# Patient Record
Sex: Female | Born: 1965 | Race: White | Hispanic: No | Smoking: Never smoker
Health system: Southern US, Community
[De-identification: ages and names within clinical notes are randomized; demographics above are authoritative.]

## PROBLEM LIST (undated history)

## (undated) DIAGNOSIS — R768 Other specified abnormal immunological findings in serum: Secondary | ICD-10-CM

## (undated) DIAGNOSIS — K219 Gastro-esophageal reflux disease without esophagitis: Secondary | ICD-10-CM

## (undated) DIAGNOSIS — R5383 Other fatigue: Secondary | ICD-10-CM

## (undated) DIAGNOSIS — D219 Benign neoplasm of connective and other soft tissue, unspecified: Secondary | ICD-10-CM

## (undated) DIAGNOSIS — R609 Edema, unspecified: Secondary | ICD-10-CM

## (undated) DIAGNOSIS — K5900 Constipation, unspecified: Secondary | ICD-10-CM

## (undated) DIAGNOSIS — E039 Hypothyroidism, unspecified: Secondary | ICD-10-CM

## (undated) DIAGNOSIS — D649 Anemia, unspecified: Secondary | ICD-10-CM

## (undated) DIAGNOSIS — M2550 Pain in unspecified joint: Secondary | ICD-10-CM

## (undated) DIAGNOSIS — N926 Irregular menstruation, unspecified: Secondary | ICD-10-CM

## (undated) DIAGNOSIS — R7689 Other specified abnormal immunological findings in serum: Secondary | ICD-10-CM

## (undated) HISTORY — DX: Benign neoplasm of connective and other soft tissue, unspecified: D21.9

## (undated) HISTORY — DX: Other specified abnormal immunological findings in serum: R76.89

## (undated) HISTORY — DX: Anemia, unspecified: D64.9

## (undated) HISTORY — DX: Other specified abnormal immunological findings in serum: R76.8

## (undated) HISTORY — DX: Irregular menstruation, unspecified: N92.6

## (undated) HISTORY — DX: Pain in unspecified joint: M25.50

## (undated) HISTORY — DX: Constipation, unspecified: K59.00

## (undated) HISTORY — DX: Gastro-esophageal reflux disease without esophagitis: K21.9

## (undated) HISTORY — DX: Edema, unspecified: R60.9

## (undated) HISTORY — DX: Other fatigue: R53.83

## (undated) HISTORY — DX: Hypothyroidism, unspecified: E03.9

## (undated) HISTORY — PX: CHOLECYSTECTOMY: SHX55

## (undated) HISTORY — PX: TUBAL LIGATION: SHX77

---

## 2016-12-07 ENCOUNTER — Encounter: Payer: Self-pay | Admitting: Gastroenterology

## 2016-12-30 ENCOUNTER — Other Ambulatory Visit: Payer: Self-pay

## 2016-12-30 ENCOUNTER — Encounter: Payer: Self-pay | Admitting: Nurse Practitioner

## 2016-12-30 ENCOUNTER — Ambulatory Visit (INDEPENDENT_AMBULATORY_CARE_PROVIDER_SITE_OTHER): Payer: BLUE CROSS/BLUE SHIELD | Admitting: Nurse Practitioner

## 2016-12-30 ENCOUNTER — Ambulatory Visit (HOSPITAL_COMMUNITY)
Admission: RE | Admit: 2016-12-30 | Discharge: 2016-12-30 | Disposition: A | Discharge status: Home / Self Care | Payer: BLUE CROSS/BLUE SHIELD | Source: Ambulatory Visit | Attending: Nurse Practitioner | Admitting: Nurse Practitioner

## 2016-12-30 DIAGNOSIS — K5900 Constipation, unspecified: Secondary | ICD-10-CM

## 2016-12-30 DIAGNOSIS — K219 Gastro-esophageal reflux disease without esophagitis: Secondary | ICD-10-CM | POA: Insufficient documentation

## 2016-12-30 DIAGNOSIS — Z0000 Encounter for general adult medical examination without abnormal findings: Secondary | ICD-10-CM | POA: Diagnosis not present

## 2016-12-30 DIAGNOSIS — Z9049 Acquired absence of other specified parts of digestive tract: Secondary | ICD-10-CM | POA: Insufficient documentation

## 2016-12-30 DIAGNOSIS — Z1211 Encounter for screening for malignant neoplasm of colon: Secondary | ICD-10-CM

## 2016-12-30 MED ORDER — NA SULFATE-K SULFATE-MG SULF 17.5-3.13-1.6 GM/177ML PO SOLN: 1.0000 | ORAL | 0 refills | Status: DC

## 2016-12-30 NOTE — Progress Notes (Addendum)
REVIEWED-NO ADDITIONAL RECOMMENDATIONS.  Primary Care Physician:  Haynes Hoehn, NP Primary Gastroenterologist:  Dr. Oneida Alar  Chief Complaint  Patient presents with  . Gastroesophageal Reflux    had pain in throat, clears throat alot    HPI:   Claire White is a 51 y.o. female who presents on referral from primary care/women's health for GERD refractory to multiple interventions. PCP notes reviewed. Has a long-standing history of GERD but abruptly worsened in November. She stopped gluten products and sodas but continues a sore throat and hoarseness, also stopped Protonix, Amitiza, ibuprofen with no change. I'll is that of her bed, no other interventions have helped.  Today she states she hasn't really had chronic GERD. She began having symptoms about 3-4 months ago. She is also due for first ever screening colonoscopy. Has reflux symptoms daily. Stopped carbonated beverages. Rare esophageal/throat burning. Has mid-chest burgling, sore throat, chronic cough/clearing of her throat. Denies epigastric pain. Is on Prilosec twice daily OTC. Does have lower abdominal pain which is intermittent, dull, uncomfortable with movement. Discomfort improves with bowel movement. Has a bowel movement about every day. Was on Amitiza which she stopped due to loose stools. Stools consistent with [place] 1, sensation of incomplete emptying. Denies hematochezia, melena, fever, chills, unintentional weight loss. Denies chest pain, dyspnea, dizziness, lightheadedness, syncope, near syncope. Denies any other upper or lower GI symptoms.  Past Medical History:  Diagnosis Date  . ANA positive   . Anemia   . Arthralgia   . Constipation   . Edema   . Fatigue   . Fibroids   . GERD (gastroesophageal reflux disease)   . Hypothyroidism   . Irregular menstruation     Past Surgical History:  Procedure Laterality Date  . CHOLECYSTECTOMY      Current Outpatient Prescriptions  Medication Sig Dispense Refill  . AMITIZA 8  MCG capsule Take 1 tablet by mouth daily.  5  . FLUoxetine (PROZAC) 40 MG capsule Take 40 mg by mouth daily.    . hydrochlorothiazide (HYDRODIURIL) 25 MG tablet Take 25 mg by mouth daily.  12  . levothyroxine (SYNTHROID, LEVOTHROID) 137 MCG tablet Take 137 mcg by mouth daily before breakfast.    . omeprazole (PRILOSEC) 20 MG capsule Take 20 mg by mouth 2 (two) times daily.    . ORSYTHIA 0.1-20 MG-MCG tablet Take 1 tablet by mouth daily.  12  . Polyethylene Glycol 3350 (MIRALAX PO) Take by mouth as needed.    . Na Sulfate-K Sulfate-Mg Sulf (SUPREP BOWEL PREP KIT) 17.5-3.13-1.6 GM/180ML SOLN Take 1 kit by mouth as directed. 1 Bottle 0   No current facility-administered medications for this visit.     Allergies as of 12/30/2016 - Review Complete 12/30/2016  Allergen Reaction Noted  . Codeine Anaphylaxis 12/30/2016    Family History  Problem Relation Age of Onset  . Colon cancer Neg Hx     Social History   Social History  . Marital status: Divorced    Spouse name: N/A  . Number of children: N/A  . Years of education: N/A   Occupational History  . Not on file.   Social History Main Topics  . Smoking status: Never Smoker  . Smokeless tobacco: Never Used  . Alcohol use No  . Drug use: No  . Sexual activity: Not on file   Other Topics Concern  . Not on file   Social History Narrative  . No narrative on file    Review of Systems: Complete ROS negative except  as per HPI.    Physical Exam: BP (!) 153/91   Pulse (!) 104   Temp 98.2 F (36.8 C) (Oral)   Ht _0  (1.6 m)   Wt 196 lb 6.4 oz (89.1 kg)   LMP 12/25/2016 (Approximate) Comment: birth control  BMI 34.79 kg/m  General:   Alert and oriented. Pleasant and cooperative. Well-nourished and well-developed.  Head:  Normocephalic and atraumatic. Eyes:  Without icterus, sclera clear and conjunctiva pink.  Ears:  Normal auditory acuity. Cardiovascular:  S1, S2 present without murmurs appreciated. Extremities without  clubbing or edema. Respiratory:  Clear to auscultation bilaterally. No wheezes, rales, or rhonchi. No distress.  Gastrointestinal:  +BS, soft, non-tender and non-distended. No HSM noted. No guarding or rebound. No masses appreciated.  Rectal:  Deferred  Musculoskalatal:  Symmetrical without gross deformities. Neurologic:  Alert and oriented x4;  grossly normal neurologically. Psych:  Alert and cooperative. Normal mood and affect. Heme/Lymph/Immune: No excessive bruising noted.    12/30/2016 4:11 PM   Disclaimer: This note was dictated with voice recognition software. Similar sounding words can inadvertently be transcribed and may not be corrected upon review.

## 2016-12-30 NOTE — Assessment & Plan Note (Signed)
The patient is now age 51 and due for first-ever screening colonoscopy. She does have constipation, although I think this is simple see IC versus IBS-C and not really indicative of a diagnostic colonoscopy. We will work on her constipation in tandem with her due screening exam. Constipation measures as per above. Return for follow-up in 2 months.

## 2016-12-30 NOTE — Assessment & Plan Note (Signed)
The patient describes about a four-month history of reflux symptoms, possibly longer, typically manifesting as sore throat, chronic dry cough, occasional esophageal gurgling/discomfort. No overt epigastric pain. She is tried Prilosec over-the-counter without relief. At this point I will start her on Dexalone 60 mg once a day. We will provide samples for 1-2 weeks and ask her to call with progress report in 1-2 weeks. Return for follow-up in 2 months.

## 2016-12-30 NOTE — Assessment & Plan Note (Signed)
The patient has constipation, although the past couple weeks she seems to be having looser stools. Amitiza caused diarrhea. At this point I will recommend she take a Colace stool softener once a day and MiraLAX one to 2 times a day as needed for no bowel movement in 2 days. Stop taking these if she has diarrhea. Return for follow-up in 2 months.

## 2016-12-30 NOTE — Patient Instructions (Addendum)
1. We will schedule your colonoscopy for you. 2. Take over-the-counter Colace stool softener once a day.  3. Use MiraLAX powder, 17 g, one to 2 times a day as needed for no bowel movement in 2 days. 4. Have your abdominal x-ray done when you can. 5. Start taking Dexilant 60 mg once a day. We will give you samples to last 1-2 weeks. Call us with a progress report on how it is helping in 1-2 weeks. 6. Return for follow-up in 2 months.

## 2016-12-30 NOTE — Progress Notes (Signed)
CC'ED TO PCP 

## 2017-01-04 NOTE — Progress Notes (Signed)
LMOM to call.

## 2017-01-08 ENCOUNTER — Ambulatory Visit (HOSPITAL_COMMUNITY)
Admission: RE | Admit: 2017-01-08 | Discharge: 2017-01-08 | Disposition: A | Discharge status: Home / Self Care | Payer: BLUE CROSS/BLUE SHIELD | Source: Ambulatory Visit | Attending: Gastroenterology | Admitting: Gastroenterology

## 2017-01-08 ENCOUNTER — Encounter (HOSPITAL_COMMUNITY)
Admission: RE | Disposition: A | Discharge status: Home / Self Care | Payer: Self-pay | Source: Ambulatory Visit | Attending: Gastroenterology

## 2017-01-08 ENCOUNTER — Encounter (HOSPITAL_COMMUNITY): Payer: Self-pay | Admitting: *Deleted

## 2017-01-08 DIAGNOSIS — Z1211 Encounter for screening for malignant neoplasm of colon: Secondary | ICD-10-CM

## 2017-01-08 DIAGNOSIS — K219 Gastro-esophageal reflux disease without esophagitis: Secondary | ICD-10-CM | POA: Diagnosis not present

## 2017-01-08 DIAGNOSIS — E039 Hypothyroidism, unspecified: Secondary | ICD-10-CM | POA: Insufficient documentation

## 2017-01-08 DIAGNOSIS — K644 Residual hemorrhoidal skin tags: Secondary | ICD-10-CM | POA: Insufficient documentation

## 2017-01-08 DIAGNOSIS — Z79899 Other long term (current) drug therapy: Secondary | ICD-10-CM | POA: Diagnosis not present

## 2017-01-08 DIAGNOSIS — K5900 Constipation, unspecified: Secondary | ICD-10-CM | POA: Diagnosis not present

## 2017-01-08 DIAGNOSIS — Z1212 Encounter for screening for malignant neoplasm of rectum: Secondary | ICD-10-CM | POA: Diagnosis not present

## 2017-01-08 DIAGNOSIS — Z793 Long term (current) use of hormonal contraceptives: Secondary | ICD-10-CM | POA: Diagnosis not present

## 2017-01-08 DIAGNOSIS — K621 Rectal polyp: Secondary | ICD-10-CM | POA: Insufficient documentation

## 2017-01-08 DIAGNOSIS — Q438 Other specified congenital malformations of intestine: Secondary | ICD-10-CM | POA: Insufficient documentation

## 2017-01-08 HISTORY — PX: COLONOSCOPY: SHX5424

## 2017-01-08 SURGERY — COLONOSCOPY
Anesthesia: Moderate Sedation

## 2017-01-08 MED ORDER — MIDAZOLAM HCL 5 MG/5ML IJ SOLN
INTRAMUSCULAR | Status: DC | PRN
  Administered 2017-01-08 (×3): 2 mg via INTRAVENOUS

## 2017-01-08 MED ORDER — SODIUM CHLORIDE 0.9 % IV SOLN
INTRAVENOUS | Status: DC
  Administered 2017-01-08: 1000 mL via INTRAVENOUS

## 2017-01-08 MED ORDER — MIDAZOLAM HCL 5 MG/5ML IJ SOLN
INTRAMUSCULAR | Status: AC
  Filled 2017-01-08: qty 10

## 2017-01-08 MED ORDER — SIMETHICONE 40 MG/0.6ML PO SUSP
ORAL | Status: DC | PRN
  Administered 2017-01-08: 2.5 mL

## 2017-01-08 MED ORDER — MEPERIDINE HCL 100 MG/ML IJ SOLN
INTRAMUSCULAR | Status: AC
  Filled 2017-01-08: qty 2

## 2017-01-08 MED ORDER — MEPERIDINE HCL 100 MG/ML IJ SOLN
INTRAMUSCULAR | Status: DC | PRN
  Administered 2017-01-08 (×3): 25 mg via INTRAVENOUS

## 2017-01-08 NOTE — Op Note (Signed)
[facility] Patient Name: Claire White Procedure Date: 01/08/2017 11:32 AM MRN: [id number] Date of Birth: 1966-07-31 Attending MD: Barney Drain , MD CSN: [id number] Age: 51 Admit Type: Outpatient Procedure:                Colonoscopy WITH COLD FORCEPS POLYPECTOMY Indications:              Screening for colorectal malignant neoplasm Providers:                Barney Drain, MD, Charlyne Petrin RN, RN, Isabella Stalling, Technician Referring MD:             Georgeanna Lea. Weeks Medicines:                Meperidine 75 mg IV, Midazolam 5 mg IV Complications:            No immediate complications. Estimated Blood Loss:     Estimated blood loss was minimal. Procedure:                Pre-Anesthesia Assessment:                           - Prior to the procedure, a History and Physical                            was performed, and patient medications and                            allergies were reviewed. The patient's tolerance of                            previous anesthesia was also reviewed. The risks                            and benefits of the procedure and the sedation                            options and risks were discussed with the patient.                            All questions were answered, and informed consent                            was obtained. Prior Anticoagulants: The patient has                            taken no previous anticoagulant or antiplatelet                            agents. ASA Grade Assessment: II - A patient with                            mild systemic disease. After reviewing the risks  and benefits, the patient was deemed in                            satisfactory condition to undergo the procedure.                            After obtaining informed consent, the colonoscope                            was passed under direct vision. Throughout the                            procedure, the patient's  blood pressure, pulse, and                            oxygen saturations were monitored continuously. The                            [id number] scope was introduced through                            the anus and advanced to the the cecum, identified                            by appendiceal orifice and ileocecal valve. The                            colonoscopy was somewhat difficult due to a                            tortuous colon. Successful completion of the                            procedure was aided by COLOWRAP. The patient                            tolerated the procedure well. The quality of the                            bowel preparation was excellent. The ileocecal                            valve, appendiceal orifice, and rectum were                            photographed. Scope In: 12:05:39 PM Scope Out: 12:23:30 PM Scope Withdrawal Time: 0 hours 14 minutes 34 seconds  Total Procedure Duration: 0 hours 17 minutes 51 seconds  Findings:      The recto-sigmoid colon, sigmoid colon and descending colon were       moderately redundant.      Four sessile polyps were found in the rectum. The polyps were 2 to 4 mm       in size. These polyps were removed with a cold biopsy forceps. Resection  and retrieval were complete.      Multiple small and large-mouthed diverticula were found in the entire       colon.      External hemorrhoids were found during retroflexion. The hemorrhoids       were small. Impression:               - Redundant LEFT colon.                           - Four 2 to 4 mm polyps in the rectum, removed                           - MODERATE Diverticulosis in the entire examined                            colon.                           - External hemorrhoids. Moderate Sedation:      Moderate (conscious) sedation was administered by the endoscopy nurse       and supervised by the endoscopist. The following parameters were       monitored:  oxygen saturation, heart rate, blood pressure, and response       to care. Total physician intraservice time was 15 minutes. Recommendation:           - Repeat colonoscopy in 5-10 years for surveillance.                           - High fiber diet.                           - Continue present medications.                           - Await pathology results.                           - Patient has a contact number available for                            emergencies. The signs and symptoms of potential                            delayed complications were discussed with the                            patient. Return to normal activities tomorrow.                            Written discharge instructions were provided to the                            patient. Procedure Code(s):        --- Professional ---                           [phone], Colonoscopy,  flexible; with biopsy, single                            or multiple                           99152, Moderate sedation services provided by the                            same physician or other qualified health care                            professional performing the diagnostic or                            therapeutic service that the sedation supports,                            requiring the presence of an independent trained                            observer to assist in the monitoring of the                            patient's level of consciousness and physiological                            status; initial 15 minutes of intraservice time,                            patient age 56 years or older Diagnosis Code(s):        --- Professional ---                           Z12.11, Encounter for screening for malignant                            neoplasm of colon                           K62.1, Rectal polyp                           K64.4, Residual hemorrhoidal skin tags                           K57.30, Diverticulosis of large  intestine without                            perforation or abscess without bleeding                           Q43.8, Other specified congenital malformations of                            intestine CPT copyright 2016 [employer] Association. All  rights reserved. The codes documented in this report are preliminary and upon coder review may  be revised to meet current compliance requirements. Barney Drain, MD Barney Drain, MD 01/08/2017 12:45:30 PM This report has been signed electronically. Number of Addenda: 0

## 2017-01-08 NOTE — Discharge Instructions (Signed)
You have small internal hemorrhoids and diverticulosis IN YOUR LEFT AND RIGHT COLON. YOU HAD ONE SMALL POLYP REMOVED FROM YOUR RIGHT COLON.   DRINK WATER TO KEEP YOUR URINE LIGHT YELLOW.  CONTINUE YOUR WEIGHT LOSS EFFORTS.  WHILE I DO NOT WANT TO ALARM YOU, YOUR BODY MASS INDEX IS OVER 30 WHICH MEANS YOU ARE OBESE. OBESITY DRIVES CANCER GENES AND  IS ASSOCIATED WITH AN INCREASE RISK FOR ALL CANCERS, INCLUDING ESOPHAGEAL AND COLON CANCER.  FOLLOW A HIGH FIBER DIET. AVOID ITEMS THAT CAUSE BLOATING. See info below.  YOUR BIOPSY RESULTS WILL BE AVAILABLE IN MY CHART AFTER FEB 27 AND MY OFFICE WILL CONTACT YOU IN 10-14 DAYS WITH YOUR RESULTS.   Next colonoscopy in 5-10 years.  Colonoscopy Care After Read the instructions outlined below and refer to this sheet in the next week. These discharge instructions provide you with general information on caring for yourself after you leave the hospital. While your treatment has been planned according to the most current medical practices available, unavoidable complications occasionally occur. If you have any problems or questions after discharge, call DR. Sherrine Salberg, [phone].  ACTIVITY  You may resume your regular activity, but move at a slower pace for the next 24 hours.   Take frequent rest periods for the next 24 hours.   Walking will help get rid of the air and reduce the bloated feeling in your belly (abdomen).   No driving for 24 hours (because of the medicine (anesthesia) used during the test).   You may shower.   Do not sign any important legal documents or operate any machinery for 24 hours (because of the anesthesia used during the test).    NUTRITION  Drink plenty of fluids.   You may resume your normal diet as instructed by your doctor.   Begin with a light meal and progress to your normal diet. Heavy or fried foods are harder to digest and may make you feel sick to your stomach (nauseated).   Avoid alcoholic beverages for 24  hours or as instructed.    MEDICATIONS  You may resume your normal medications.   WHAT YOU CAN EXPECT TODAY  Some feelings of bloating in the abdomen.   Passage of more gas than usual.   Spotting of blood in your stool or on the toilet paper  .  IF YOU HAD POLYPS REMOVED DURING THE COLONOSCOPY:  Eat a soft diet IF YOU HAVE NAUSEA, BLOATING, ABDOMINAL PAIN, OR VOMITING.    FINDING OUT THE RESULTS OF YOUR TEST Not all test results are available during your visit. DR. Oneida Alar WILL CALL YOU WITHIN 14 DAYS OF YOUR PROCEDUE WITH YOUR RESULTS. Do not assume everything is normal if you have not heard from DR. Kensington Duerst  SEEK IMMEDIATE MEDICAL ATTENTION AND CALL THE OFFICE: [phone] IF:  You have more than a spotting of blood in your stool.   Your belly is swollen (abdominal distention).   You are nauseated or vomiting.   You have a temperature over 101F.   You have abdominal pain or discomfort that is severe or gets worse throughout the day.  High-Fiber Diet A high-fiber diet changes your normal diet to include more whole grains, legumes, fruits, and vegetables. Changes in the diet involve replacing refined carbohydrates with unrefined foods. The calorie level of the diet is essentially unchanged. The Dietary Reference Intake (recommended amount) for adult males is 38 grams per day. For adult females, it is 25 grams per day. Pregnant and lactating women  should consume 28 grams of fiber per day. Fiber is the intact part of a plant that is not broken down during digestion. Functional fiber is fiber that has been isolated from the plant to provide a beneficial effect in the body. PURPOSE  Increase stool bulk.   Ease and regulate bowel movements.   Lower cholesterol.   REDUCE RISK OF COLON CANCER  INDICATIONS THAT YOU NEED MORE FIBER  Constipation and hemorrhoids.   Uncomplicated diverticulosis (intestine condition) and irritable bowel syndrome.   Weight management.   As  a protective measure against hardening of the arteries (atherosclerosis), diabetes, and cancer.   GUIDELINES FOR INCREASING FIBER IN THE DIET  Start adding fiber to the diet slowly. A gradual increase of about 5 more grams (2 slices of whole-wheat bread, 2 servings of most fruits or vegetables, or 1 bowl of high-fiber cereal) per day is best. Too rapid an increase in fiber may result in constipation, flatulence, and bloating.   Drink enough water and fluids to keep your urine clear or pale yellow. Water, juice, or caffeine-free drinks are recommended. Not drinking enough fluid may cause constipation.   Eat a variety of high-fiber foods rather than one type of fiber.   Try to increase your intake of fiber through using high-fiber foods rather than fiber pills or supplements that contain small amounts of fiber.   The goal is to change the types of food eaten. Do not supplement your present diet with high-fiber foods, but replace foods in your present diet.   Polyps, Colon  A polyp is extra tissue that grows inside your body. Colon polyps grow in the large intestine. The large intestine, also called the colon, is part of your digestive system. It is a long, hollow tube at the end of your digestive tract where your body makes and stores stool. Most polyps are not dangerous. They are benign. This means they are not cancerous. But over time, some types of polyps can turn into cancer. Polyps that are smaller than a pea are usually not harmful. But larger polyps could someday become or may already be cancerous. To be safe, doctors remove all polyps and test them.   PREVENTION There is not one sure way to prevent polyps. You might be able to lower your risk of getting them if you:  Eat more fruits and vegetables and less fatty food.   Do not smoke.   Avoid alcohol.   Exercise every day.   Lose weight if you are overweight.   Eating more calcium and folate can also lower your risk of getting  polyps. Some foods that are rich in calcium are milk, cheese, and broccoli. Some foods that are rich in folate are chickpeas, kidney beans, and spinach.    Diverticulosis Diverticulosis is a common condition that develops when small pouches (diverticula) form in the wall of the colon. The risk of diverticulosis increases with age. It happens more often in people who eat a low-fiber diet. Most individuals with diverticulosis have no symptoms. Those individuals with symptoms usually experience belly (abdominal) pain, constipation, or loose stools (diarrhea).  HOME CARE INSTRUCTIONS  Increase the amount of fiber in your diet as directed by your caregiver or dietician. This may reduce symptoms of diverticulosis.   Drink at least 6 to 8 glasses of water each day to prevent constipation.   Try not to strain when you have a bowel movement.   Avoiding nuts and seeds to prevent complications is  NOT NECESSARY.  FOODS HAVING HIGH FIBER CONTENT INCLUDE:  Fruits. Apple, peach, pear, tangerine, raisins, prunes.   Vegetables. Brussels sprouts, asparagus, broccoli, cabbage, carrot, cauliflower, romaine lettuce, spinach, summer squash, tomato, winter squash, zucchini.   Starchy Vegetables. Baked beans, kidney beans, lima beans, split peas, lentils, potatoes (with skin).   Grains. Whole wheat bread, brown rice, bran flake cereal, plain oatmeal, white rice, shredded wheat, bran muffins.    SEEK IMMEDIATE MEDICAL CARE IF:  You develop increasing pain or severe bloating.   You have an oral temperature above 101F.   You develop vomiting or bowel movements that are bloody or black.   Hemorrhoids Hemorrhoids are dilated (enlarged) veins around the rectum. Sometimes clots will form in the veins. This makes them swollen and painful. These are called thrombosed hemorrhoids. Causes of hemorrhoids include:  Constipation.   Straining to have a bowel movement.   HEAVY LIFTING  HOME CARE  INSTRUCTIONS  Eat a well balanced diet and drink 6 to 8 glasses of water every day to avoid constipation. You may also use a bulk laxative.   Avoid straining to have bowel movements.   Keep anal area dry and clean.   Do not use a donut shaped pillow or sit on the toilet for long periods. This increases blood pooling and pain.   Move your bowels when your body has the urge; this will require less straining and will decrease pain and pressure.

## 2017-01-08 NOTE — H&P (Signed)
  Primary Care Physician:  Haynes Hoehn, NP Primary Gastroenterologist:  Dr. Oneida Alar  Pre-Procedure History & Physical: HPI:  Claire White is a 51 y.o. female here for [place].  Past Medical History:  Diagnosis Date  . ANA positive   . Anemia   . Arthralgia   . Constipation   . Edema   . Fatigue   . Fibroids   . GERD (gastroesophageal reflux disease)   . Hypothyroidism   . Irregular menstruation     Past Surgical History:  Procedure Laterality Date  . CHOLECYSTECTOMY    . TUBAL LIGATION      Prior to Admission medications   Medication Sig Start Date End Date Taking? Authorizing Provider  dexlansoprazole (DEXILANT) 60 MG capsule Take 60 mg by mouth daily.   Yes Historical Provider, MD  FLUoxetine (PROZAC) 40 MG capsule Take 40 mg by mouth daily. 06/24/12  Yes Historical Provider, MD  hydrochlorothiazide (HYDRODIURIL) 25 MG tablet Take 25 mg by mouth daily. 11/30/16  Yes Historical Provider, MD  levothyroxine (SYNTHROID, LEVOTHROID) 137 MCG tablet Take 137 mcg by mouth daily before breakfast.   Yes Historical Provider, MD  ORSYTHIA 0.1-20 MG-MCG tablet Take 1 tablet by mouth daily. 12/13/16  Yes Historical Provider, MD  Polyethylene Glycol 3350 (MIRALAX PO) Take 17 g by mouth daily as needed.    Yes Historical Provider, MD    Allergies as of 12/30/2016 - Review Complete 12/30/2016  Allergen Reaction Noted  . Codeine Anaphylaxis 12/30/2016    Family History  Problem Relation Age of Onset  . Heart disease Father   . Alcoholism Brother   . Colon cancer Neg Hx     Social History   Social History  . Marital status: Divorced    Spouse name: N/A  . Number of children: N/A  . Years of education: N/A   Occupational History  . Not on file.   Social History Main Topics  . Smoking status: Never Smoker  . Smokeless tobacco: Never Used  . Alcohol use No  . Drug use: No  . Sexual activity: Not on file   Other Topics Concern  . Not on file   Social History  Narrative  . No narrative on file    Review of Systems: See HPI, otherwise negative ROS   Physical Exam: BP 122/75   Pulse 87   Temp 97.9 F (36.6 C) (Oral)   Resp 11   Ht 5\' 3"  (1.6 m)   Wt 196 lb (88.9 kg)   LMP 12/25/2016 (Approximate) Comment: birth control  SpO2 98%   BMI 34.72 kg/m  General:   Alert,  pleasant and cooperative in NAD Head:  Normocephalic and atraumatic. Neck:  Supple; Lungs:  Clear throughout to auscultation.    Heart:  Regular rate and rhythm. Abdomen:  Soft, nontender and nondistended. Normal bowel sounds, without guarding, and without rebound.   Neurologic:  Alert and  oriented x4;  grossly normal neurologically.  Impression/Plan:     SCREENING  Plan:  1. TCS TODAY. DISCUSSED PROCEDURE, BENEFITS, & RISKS: < 1% chance of medication reaction, bleeding, perforation, or rupture of spleen/liver.

## 2017-01-12 ENCOUNTER — Encounter (HOSPITAL_COMMUNITY): Payer: Self-pay | Admitting: Gastroenterology

## 2017-01-13 ENCOUNTER — Telehealth: Payer: Self-pay | Admitting: Gastroenterology

## 2017-01-13 DIAGNOSIS — K219 Gastro-esophageal reflux disease without esophagitis: Secondary | ICD-10-CM

## 2017-01-13 NOTE — Telephone Encounter (Signed)
PATIENT WAS GIVEN DEXILANT SAMPLES AND SHE IS STILL HAVING REFLUX, IS THERE ANYTHING ELSE SHE CAN TRY?  [phone]  SHE STATED THE OTC MEDS SHE WAS TAKING FOR REFLUX WORKED BETTER BUT SHE HAD TO DOUBLE ON THE AMOUNT SHE WOULD TAKE OF THOSE

## 2017-01-14 NOTE — Telephone Encounter (Signed)
LMOM to call.

## 2017-01-19 NOTE — Telephone Encounter (Signed)
LMOM to call. Forwarding to Claire Field, NP who last saw pt in the office.

## 2017-01-19 NOTE — Telephone Encounter (Signed)
PT called and said she has tried the Prilosec/Omeprazole and had to double the amounts of those to take. Please advise!

## 2017-01-25 ENCOUNTER — Encounter: Payer: Self-pay | Admitting: Nurse Practitioner

## 2017-01-26 MED ORDER — PANTOPRAZOLE SODIUM 40 MG PO TBEC: 40.0000 mg | DELAYED_RELEASE_TABLET | Freq: Every day | ORAL | 2 refills | Status: DC

## 2017-01-26 NOTE — Telephone Encounter (Signed)
Please tell the patient I sent in an Rx for Protonix 40 mg. Take once daily on an empty stomach. Call us if no improvement in 2 weeks.   I'm not sure how the message was kicked from my in box, but will try to keep an eye out from further communication from her.

## 2017-01-26 NOTE — Addendum Note (Signed)
Addended by: Gordy Levan, ERIC A on: 01/26/2017 12:12 PM   Modules accepted: Orders

## 2017-01-28 NOTE — Telephone Encounter (Signed)
LMOM to call.

## 2017-01-29 NOTE — Telephone Encounter (Signed)
Pt is aware.  

## 2017-01-29 NOTE — Telephone Encounter (Signed)
LMOM to call.

## 2017-03-02 ENCOUNTER — Ambulatory Visit: Payer: BLUE CROSS/BLUE SHIELD | Admitting: Nurse Practitioner

## 2017-04-30 HISTORY — PX: FOOT SURGERY: SHX648

## 2017-05-07 ENCOUNTER — Other Ambulatory Visit: Payer: Self-pay | Admitting: Nurse Practitioner

## 2017-05-07 DIAGNOSIS — K219 Gastro-esophageal reflux disease without esophagitis: Secondary | ICD-10-CM

## 2017-05-27 ENCOUNTER — Encounter: Payer: Self-pay | Admitting: Nurse Practitioner

## 2017-06-01 ENCOUNTER — Encounter: Payer: Self-pay | Admitting: Nurse Practitioner

## 2017-06-11 ENCOUNTER — Other Ambulatory Visit: Payer: Self-pay | Admitting: Nurse Practitioner

## 2017-06-11 DIAGNOSIS — K219 Gastro-esophageal reflux disease without esophagitis: Secondary | ICD-10-CM

## 2017-06-11 MED ORDER — PANTOPRAZOLE SODIUM 40 MG PO TBEC: 40.0000 mg | DELAYED_RELEASE_TABLET | Freq: Two times a day (BID) | ORAL | 3 refills | Status: AC

## 2017-06-11 MED ORDER — LIDOCAINE VISCOUS 2 % MT SOLN: 10.0000 mL | Freq: Four times a day (QID) | OROMUCOSAL | 1 refills | Status: AC | PRN

## 2017-07-21 ENCOUNTER — Encounter: Payer: Self-pay | Admitting: Nurse Practitioner

## 2017-07-21 ENCOUNTER — Ambulatory Visit (INDEPENDENT_AMBULATORY_CARE_PROVIDER_SITE_OTHER): Payer: BLUE CROSS/BLUE SHIELD | Admitting: Nurse Practitioner

## 2017-07-21 VITALS — BP 159/86 | HR 109 | Temp 98.1°F | Ht 63.0 in | Wt 199.6 lb

## 2017-07-21 DIAGNOSIS — K219 Gastro-esophageal reflux disease without esophagitis: Secondary | ICD-10-CM | POA: Diagnosis not present

## 2017-07-21 DIAGNOSIS — K5900 Constipation, unspecified: Secondary | ICD-10-CM

## 2017-07-21 NOTE — Assessment & Plan Note (Signed)
The patient has some improvement constipation. She is taking Amitiza once a day which generally works well for her. She takes it twice a day regularly she has difficulty controlling her bowels. She will take a second dose as needed for worsening breakthrough constipation. She does have occasional sensation of incomplete emptying. I recommended she supplement of fiber in her diet with a fiber supplement available over-the-counter from the pharmacy. I discussed with her the multiple options available and she can she is likely she is most likely to stick with. Return for follow-up in 6 months. Call if any worsening problems.

## 2017-07-21 NOTE — Assessment & Plan Note (Signed)
GERD symptoms overall seem to be well controlled. No esophageal burning, epigastric pain, that her sour taste. The patient does have some "doubling feeling" which she attributes to GERD. She is currently on twice a day PPI. No other increase in therapy at this time. Dexon did not help. Has previously been on Protonix which did not help. Recommended continue use viscous lidocaine as needed. Call if any worsening. Return for follow-up in 6 months.

## 2017-07-21 NOTE — Patient Instructions (Signed)
1. Go to the pharmacy and find a fiber supplement. As we talked about, there are multiple options available including fruits use, powders, dissolvable liquids, gum each use, etc. If you need assistance she can ask the pharmacist for help in selecting the option best for you. 2. Continue taking your acid blocker twice a day. 3. Use viscous lidocaine as needed for breakthrough symptoms. 4. Return for follow-up in 6 months. 5. Call with any questions or concerns.

## 2017-07-21 NOTE — Progress Notes (Signed)
Referring Provider: Renne Crigler, NP Primary Care Physician:  Renne Crigler, NP Primary GI:  Dr. Oneida Alar  Chief Complaint  Patient presents with  . Constipation    HPI:   Claire White is a 51 y.o. female who presents for follow-up on GERD and constipation. The patient was last seen in our office 12/30/2016 for the same as well as to schedule screening colonoscopy. History of chronic, refractory GERD. At the time of her last visit she noted daily reflux symptoms, stopped carbonated beverages. Rare esophageal/throat burning but notes mid chest bubbling, sore throat, chronic cough and chronic clearing of her throat. On Prilosec twice a day at that time through over-the-counter formulation. Lower abdominal pain which is intermittent, dull, uncomfortable with movement. Discomfort improves with a bowel movement which she has about every day. Was on Amitiza but stopped due to loose stools. Has sensation of incomplete emptying and stools are hard consistent with [place] 1. No other GI symptoms. Recommended over-the-counter Colace stool softener once a day, MiraLAX one to 2 times a day if no bowel movement in 2 days, abdominal x-ray, start Dexilant 60 mg once a day with samples provided, schedule colonoscopy. Follow-up in 2 months.  Colonoscopy was completed 01/08/2017 which found redundant left colon, four 2-4 mm polyps in the rectum status post removal, moderate diverticulosis in the entire examined colon, external hemorrhoids. Surgical pathology found the polyps to be hyperplastic. Recommended repeat colonoscopy in 10 years, continue current medications, high-fiber diet.  Phone call indicated Dexilant was not effective and a prescription for Protonix 40 mg once daily was sent to her pharmacy. She called [place] again 05/27/2017 to say she was having worsening reflux still taking Protonix once a day 40 mg but is started taking over-the-counter omeprazole in the evenings as well. Her Protonix was increased to  40 mg twice a day, viscous lidocaine sent to the pharmacy for breakthrough. Denied NSAID use. No further communications.  Today she states she's doing well overall. GERD is persistent despite bid PPI. It is improved, however. Has breakthrough symptoms daily including "gurgling/bubbling" in her esophagus but denies pain, burning, bitter taste. Constipation is improved. Has a large bowel movement this past weekend. She takes Amitiza once daily unless she's having breakthrough constipation at which point she will take a second dose; If she takes bid dosing regularly has difficulty controlling her bowels. Does have intermittent sensation of incomplete emptying. Denies melena. Has rare scant toilet tissue hematochezia. Denies chest pain, dyspnea, dizziness, lightheadedness, syncope, near syncope. Denies any other upper or lower GI symptoms.  Past Medical History:  Diagnosis Date  . ANA positive   . Anemia   . Arthralgia   . Constipation   . Edema   . Fatigue   . Fibroids   . GERD (gastroesophageal reflux disease)   . Hypothyroidism   . Irregular menstruation     Past Surgical History:  Procedure Laterality Date  . CHOLECYSTECTOMY    . COLONOSCOPY N/A 01/08/2017   Procedure: COLONOSCOPY;  Surgeon: Danie Binder, MD;  Location: [facility];  Service: Endoscopy;  Laterality: N/A;  1115  . FOOT SURGERY Left 04/30/2017   repair of hammertoe and pinched nerve  . TUBAL LIGATION      Current Outpatient Prescriptions  Medication Sig Dispense Refill  . FLUoxetine (PROZAC) 40 MG capsule Take 40 mg by mouth daily.    . hydrochlorothiazide (HYDRODIURIL) 25 MG tablet Take 25 mg by mouth daily.  12  . levothyroxine (SYNTHROID,  LEVOTHROID) 137 MCG tablet Take 137 mcg by mouth daily before breakfast.    . lidocaine (XYLOCAINE) 2 % solution Use as directed 10 mLs in the mouth or throat every 6 (six) hours as needed for mouth pain. 100 mL 1  . lubiprostone (AMITIZA) 8 MCG capsule Take 8 mcg by mouth 2  (two) times daily with a meal.    . ORSYTHIA 0.1-20 MG-MCG tablet Take 1 tablet by mouth daily.  12  . pantoprazole (PROTONIX) 40 MG tablet Take 1 tablet (40 mg total) by mouth 2 (two) times daily before a meal. 60 tablet 3   No current facility-administered medications for this visit.     Allergies as of 07/21/2017 - Review Complete 07/21/2017  Allergen Reaction Noted  . Codeine Anaphylaxis 12/30/2016    Family History  Problem Relation Age of Onset  . Heart disease Father   . Alcoholism Brother   . Colon cancer Neg Hx     Social History   Social History  . Marital status: Divorced    Spouse name: N/A  . Number of children: N/A  . Years of education: N/A   Social History Main Topics  . Smoking status: Never Smoker  . Smokeless tobacco: Never Used  . Alcohol use No  . Drug use: No  . Sexual activity: Not Asked   Other Topics Concern  . None   Social History Narrative  . None    Review of Systems: Complete ROS negative except as per HPI.   Physical Exam: BP (!) 159/86   Pulse (!) 109   Temp 98.1 F (36.7 C) (Oral)   Ht 5\' 3"  (1.6 m)   Wt 199 lb 9.6 oz (90.5 kg)   BMI 35.36 kg/m  General:   Obese female. Alert and oriented. Pleasant and cooperative. Well-nourished and well-developed.  Eyes:  Without icterus, sclera clear and conjunctiva pink.  Ears:  Normal auditory acuity. Cardiovascular:  S1, S2 present without murmurs appreciated. Extremities without clubbing or edema. Respiratory:  Clear to auscultation bilaterally. No wheezes, rales, or rhonchi. No distress.  Gastrointestinal:  +BS, rounded but soft, non-tender and non-distended. No HSM noted. No guarding or rebound. No masses appreciated.  Rectal:  Deferred  Musculoskalatal:  Symmetrical without gross deformities.  Neurologic:  Alert and oriented x4;  grossly normal neurologically. Psych:  Alert and cooperative. Normal mood and affect. Heme/Lymph/Immune: No excessive bruising noted.    07/21/2017  4:29 PM   Disclaimer: This note was dictated with voice recognition software. Similar sounding words can inadvertently be transcribed and may not be corrected upon review.

## 2017-07-22 ENCOUNTER — Encounter: Payer: Self-pay | Admitting: Gastroenterology

## 2017-07-26 NOTE — Progress Notes (Signed)
CC'ED TO PCP 

## 2017-12-17 ENCOUNTER — Other Ambulatory Visit: Payer: Self-pay | Admitting: Nurse Practitioner

## 2017-12-17 DIAGNOSIS — K219 Gastro-esophageal reflux disease without esophagitis: Secondary | ICD-10-CM

## 2018-01-19 ENCOUNTER — Ambulatory Visit: Payer: BLUE CROSS/BLUE SHIELD | Admitting: Gastroenterology

## 2018-01-19 ENCOUNTER — Ambulatory Visit: Payer: BLUE CROSS/BLUE SHIELD | Admitting: Nurse Practitioner

## 2018-07-19 ENCOUNTER — Telehealth: Payer: Self-pay

## 2018-07-19 NOTE — Telephone Encounter (Signed)
Pt was seen at [facility] today. Released was signed and faxed to our office. Please send records to this office. TCS report was faxed per Dr's request today. Release is located on [employer].

## 2018-07-21 NOTE — Telephone Encounter (Signed)
done

## 2018-08-14 IMAGING — DX DG ABDOMEN 2V
2 series · 2 of 2 positions shown · non-contrast
Comparison: None

CLINICAL DATA: Diarrhea for 1 week, abdominal discomfort, bloating,
constipation, GERD

EXAM:
ABDOMEN - 2 VIEW

[abdomen erect]
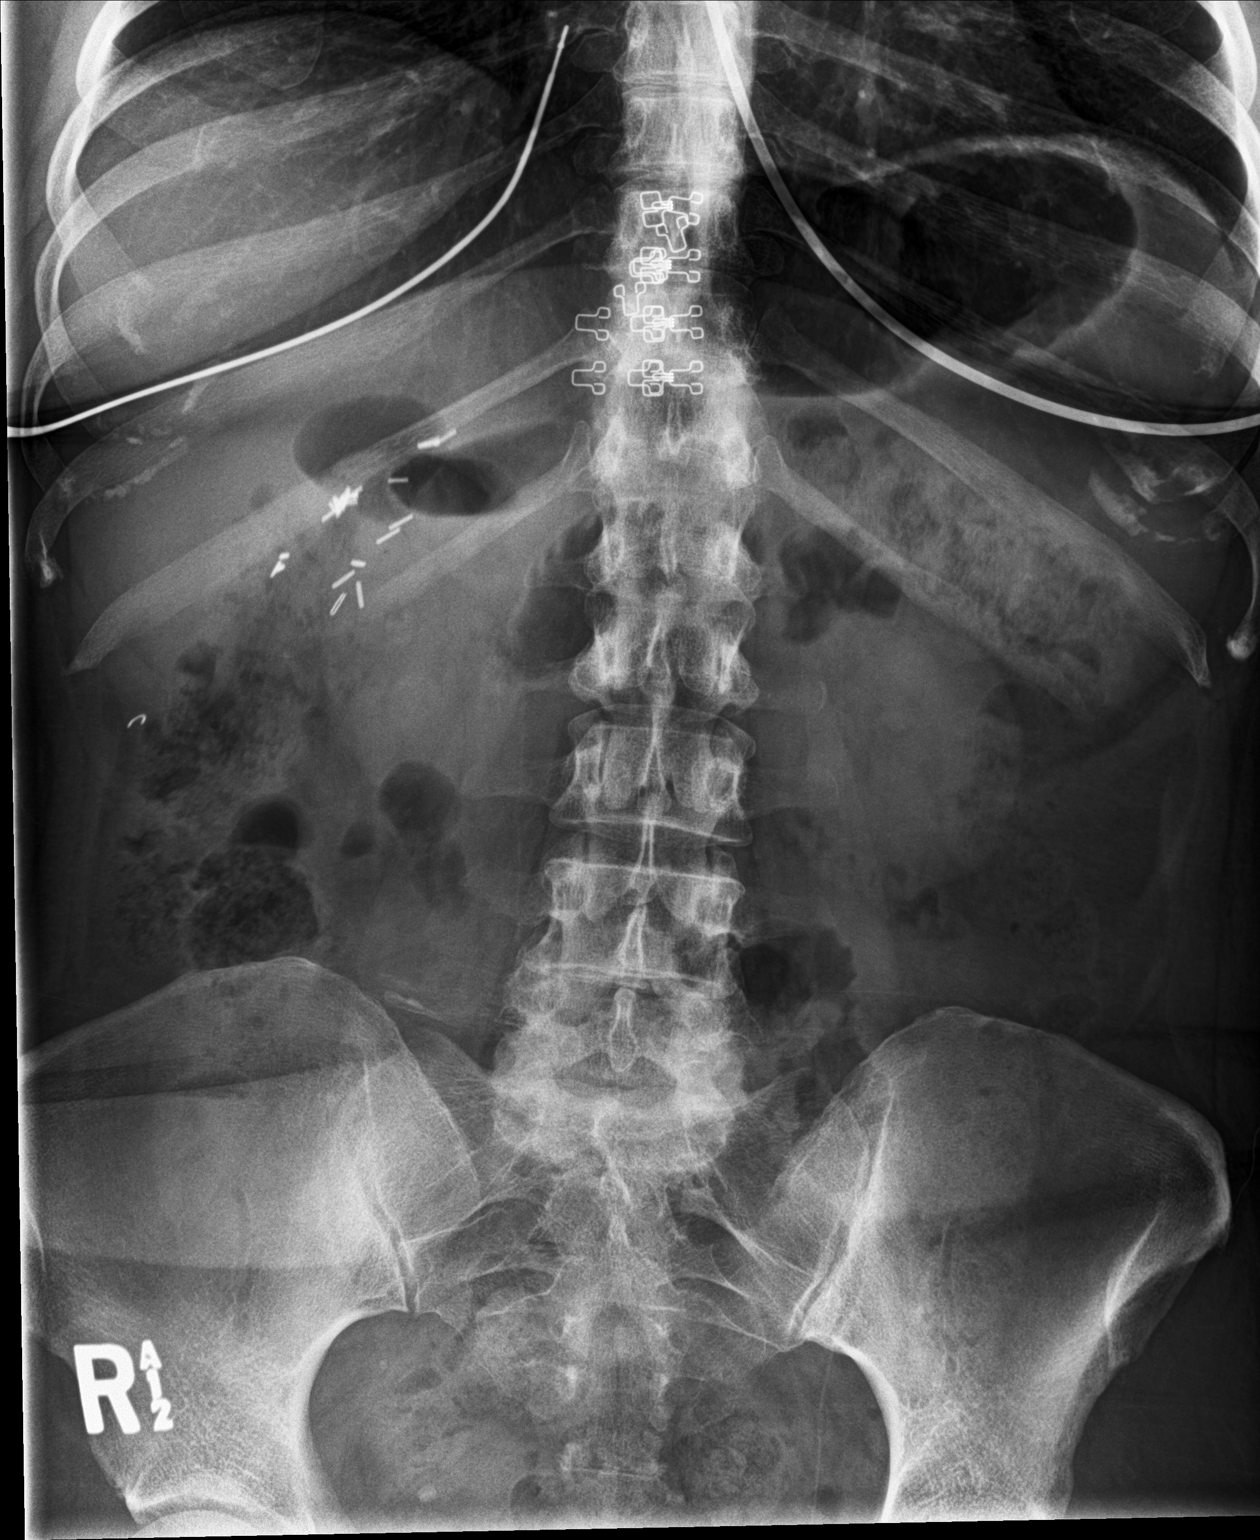

[abdomen supine]
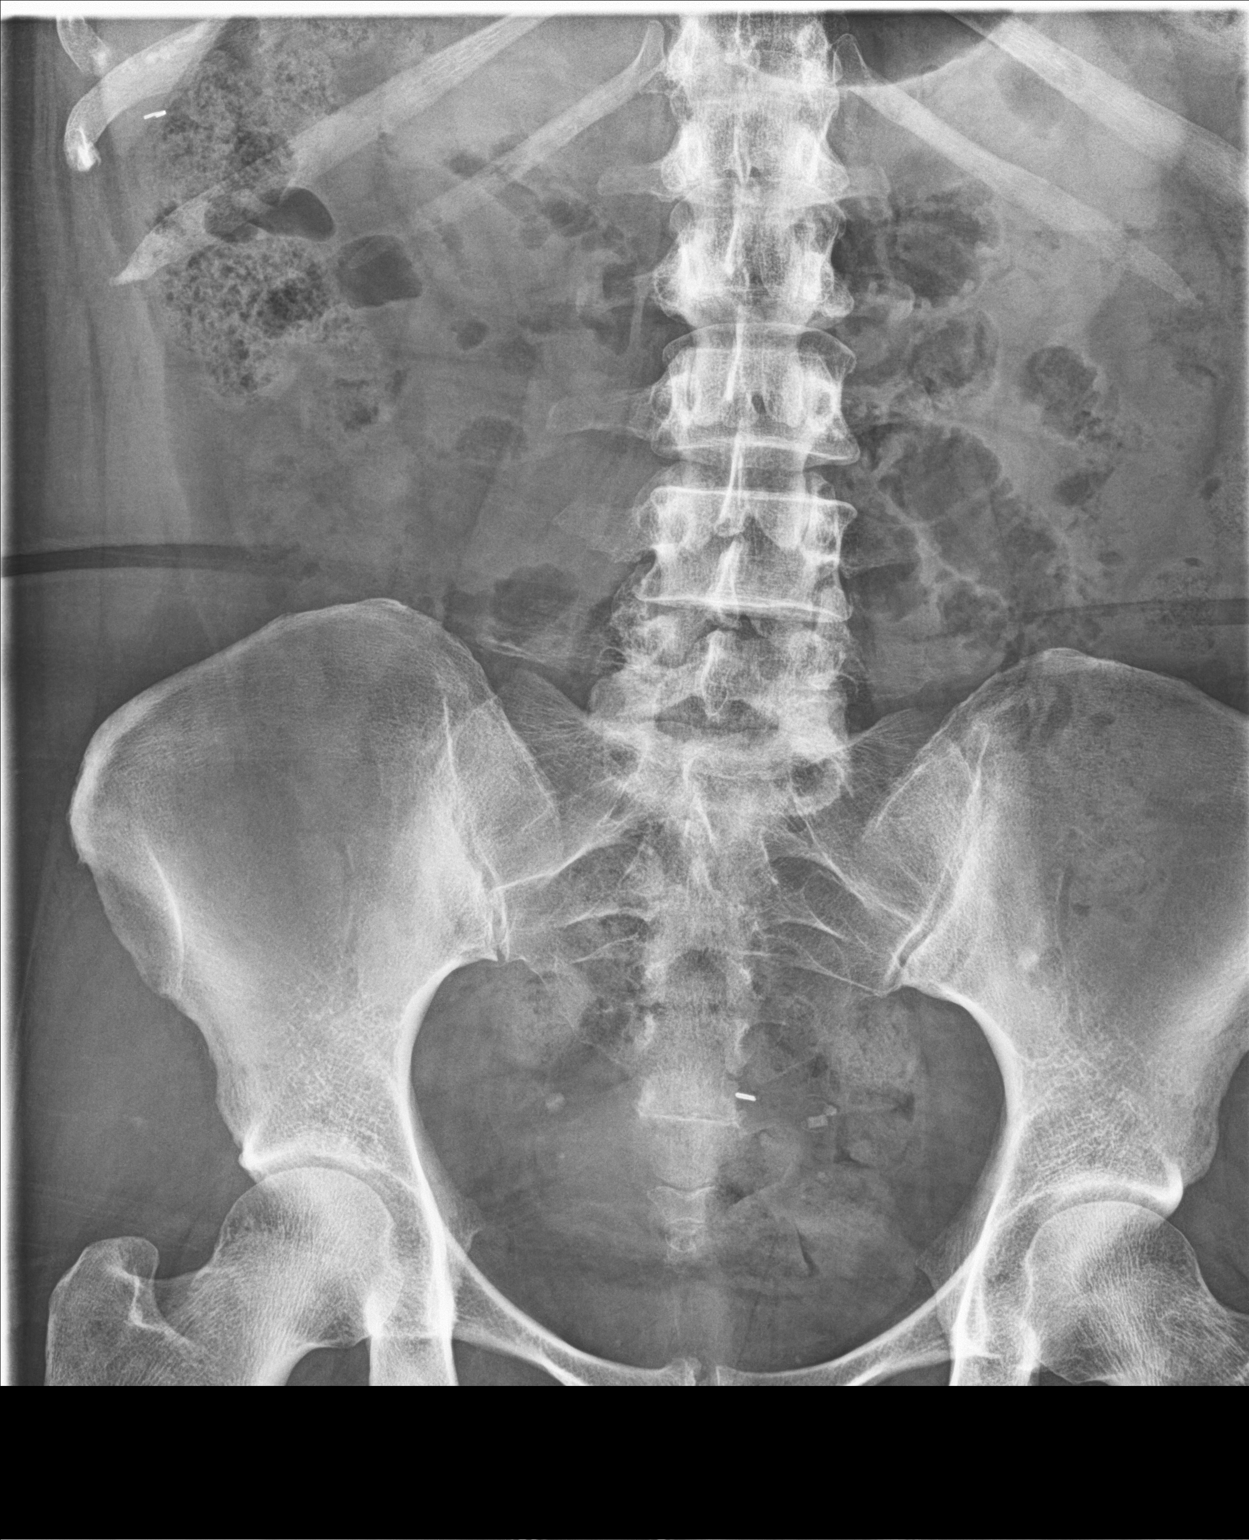

[2 of 2 positions shown; findings below may reference images not displayed]

FINDINGS: Surgical clips RIGHT upper quadrant question cholecystectomy.

Minimal stool in ascending through transverse colon.

Normal retained stool burden overall.

No bowel dilatation or bowel wall thickening.

Bones appear demineralized.

No ureter calcification.

Small pelvic phleboliths and suspect prior tubal ligation.
IMPRESSION: Normal retained stool burden.

## 2020-02-20 NOTE — Progress Notes (Signed)
Pt transferred care to [facility].
